# Patient Record
Sex: Male | Born: 1965 | Race: Black or African American | Hispanic: No | Marital: Married | Smoking: Never smoker
Health system: Southern US, Community
[De-identification: ages and names within clinical notes are randomized; demographics above are authoritative.]

## PROBLEM LIST (undated history)

## (undated) DIAGNOSIS — I10 Essential (primary) hypertension: Secondary | ICD-10-CM

## (undated) HISTORY — DX: Essential (primary) hypertension: I10

---

## 2000-02-16 ENCOUNTER — Encounter (HOSPITAL_BASED_OUTPATIENT_CLINIC_OR_DEPARTMENT_OTHER): Payer: Self-pay | Admitting: General Surgery

## 2000-02-18 ENCOUNTER — Ambulatory Visit (HOSPITAL_COMMUNITY): Admission: RE | Admit: 2000-02-18 | Discharge: 2000-02-18 | Payer: Self-pay | Admitting: General Surgery

## 2000-02-18 ENCOUNTER — Encounter (INDEPENDENT_AMBULATORY_CARE_PROVIDER_SITE_OTHER): Payer: Self-pay | Admitting: *Deleted

## 2001-09-17 ENCOUNTER — Emergency Department (HOSPITAL_COMMUNITY): Admission: EM | Admit: 2001-09-17 | Discharge: 2001-09-17 | Payer: Self-pay | Admitting: *Deleted

## 2009-07-29 ENCOUNTER — Observation Stay (HOSPITAL_COMMUNITY): Admission: EM | Admit: 2009-07-29 | Discharge: 2009-07-30 | Payer: Self-pay | Admitting: Emergency Medicine

## 2009-07-31 DIAGNOSIS — R079 Chest pain, unspecified: Secondary | ICD-10-CM | POA: Insufficient documentation

## 2009-08-05 ENCOUNTER — Ambulatory Visit: Payer: Self-pay | Admitting: Cardiovascular Disease

## 2009-08-05 DIAGNOSIS — I517 Cardiomegaly: Secondary | ICD-10-CM | POA: Insufficient documentation

## 2009-08-05 DIAGNOSIS — I10 Essential (primary) hypertension: Secondary | ICD-10-CM | POA: Insufficient documentation

## 2009-08-21 ENCOUNTER — Encounter: Payer: Self-pay | Admitting: Cardiovascular Disease

## 2009-08-21 ENCOUNTER — Ambulatory Visit: Payer: Self-pay

## 2009-08-21 ENCOUNTER — Ambulatory Visit: Payer: Self-pay | Admitting: Cardiology

## 2009-08-21 ENCOUNTER — Ambulatory Visit (HOSPITAL_COMMUNITY): Admission: RE | Admit: 2009-08-21 | Discharge: 2009-08-21 | Payer: Self-pay | Admitting: Cardiovascular Disease

## 2009-09-04 ENCOUNTER — Ambulatory Visit: Payer: Self-pay | Admitting: Cardiovascular Disease

## 2009-09-04 DIAGNOSIS — I119 Hypertensive heart disease without heart failure: Secondary | ICD-10-CM | POA: Insufficient documentation

## 2010-09-30 NOTE — Assessment & Plan Note (Signed)
Summary: per check out/sf   Visit Type:  Follow-up Primary Provider:  No PCP at this time  CC:  no cardiac complaints today.  History of Present Illness: 45 yo AAM with recently diagnosed HTN who was admitted to [facility] last month for chest pain and was monitored overnight. He ruled out for an MI with serial cardiac enzymes and was discharged to home. He was started on Metoprolol. He has had no recurrence of pain since he left the hospital. The pain at the time of admission is described as a dull ache that lasted for four days. He tells me that it was more of abnormal sensation than pain. No associated SOB, diaphoresis, dizziness, nausea or vomiting. It was continuous for four days without waxing or waning in intensity. I saw him four weeks ago and increased his Metoprolol to 25 mg two times a day. I also ordered an echo with the finding of possible LVH on his ekg.   He is here today for follow up. He has had no recurrence of his chest pain. He has no other complaints.   Current Medications (verified): 1)  Metoprolol Tartrate 25 Mg Tabs (Metoprolol Tartrate) .... Take One Tablet By Mouth Twice A Day  Allergies (verified): No Known Drug Allergies  Past History:  Past Medical History: Reviewed history from 08/05/2009 and no changes required. HTN     Social History: Reviewed history from 08/05/2009 and no changes required. No tobacco No alcohol No ilicit drug use Married 3 children Self employed accounting and tax work  Review of Systems  The patient denies fatigue, malaise, fever, weight gain/loss, vision loss, decreased hearing, hoarseness, chest pain, palpitations, shortness of breath, prolonged cough, wheezing, sleep apnea, coughing up blood, abdominal pain, blood in stool, nausea, vomiting, diarrhea, heartburn, incontinence, blood in urine, muscle weakness, joint pain, leg swelling, rash, skin lesions, headache, fainting, dizziness, depression, anxiety, enlarged lymph nodes, easy  bruising or bleeding, and environmental allergies.    Vital Signs:  Patient profile:   45 year old male Height:      67 inches Weight:      197 pounds BMI:     30.97 Pulse rate:   68 / minute Pulse rhythm:   regular BP sitting:   120 / 90  (left arm) Cuff size:   large  Vitals Entered By: Danielle Rankin, CMA (September 04, 2009 4:15 PM)  Physical Exam  General:  General: Well developed, well nourished, NAD HEENT: OP clear, mucus membranes moist Psychiatric: Mood and affect normal Neck: No JVD, no carotid bruits, no thyromegaly, no lymphadenopathy. Lungs:Clear bilaterally, no wheezes, rhonci, crackles CV: RRR no murmurs, gallops rubs Abdomen: soft, NT, ND, BS present Extremities: No edema, pulses 2+.    Echocardiogram  Procedure date:  08/21/2009  Findings:      Left ventricle: The cavity size was normal. Wall thickness was     increased in a pattern of mild LVH. Systolic function was normal.     The estimated ejection fraction was in the range of 60% to 65%. Wall     motion was normal; there were no regional wall motion abnormalities.     Doppler parameters are consistent with abnormal left ventricular     relaxation (grade 1 diastolic dysfunction).   Impression & Recommendations:  Problem # 1:  HYPERTENSION, HEART CONTROLLED W/O ASSOC CHF (ICD-402.10) Echo with normal LV systolic function and mild LVH with grade 1 diastolic dysfunction. I have discussed the importance of good blood pressure control  as well as diet, weight loss and exercise. Repeat echo one year. Follow up one year.   His updated medication list for this problem includes:    Metoprolol Tartrate 25 Mg Tabs (Metoprolol tartrate) .Marland Kitchen... Take one tablet by mouth twice a day  Patient Instructions: 1)  Your physician recommends that you schedule a follow-up appointment in: 12 months 2)  Your physician has requested that you have an echocardiogram.  Echocardiography is a painless test that uses sound waves to  create images of your heart. It provides your doctor with information about the size and shape of your heart and how well your heart's chambers and valves are working.  This procedure takes approximately one hour. There are no restrictions for this procedure.  To be done in about a year prior to office visit

## 2010-12-03 LAB — CBC
HCT: 40.7 % (ref 39.0–52.0)
HCT: 43.1 % (ref 39.0–52.0)
Hemoglobin: 13.4 g/dL (ref 13.0–17.0)
Hemoglobin: 14.3 g/dL (ref 13.0–17.0)
MCHC: 33.2 g/dL (ref 30.0–36.0)
MCV: 86 fL (ref 78.0–100.0)
Platelets: 223 10*3/uL (ref 150–400)
Platelets: 238 10*3/uL (ref 150–400)
RBC: 5.01 MIL/uL (ref 4.22–5.81)
RDW: 12.8 % (ref 11.5–15.5)
WBC: 6.1 10*3/uL (ref 4.0–10.5)
WBC: 7.5 10*3/uL (ref 4.0–10.5)

## 2010-12-03 LAB — LIPID PANEL
HDL: 24 mg/dL — ABNORMAL LOW (ref >39)
LDL Cholesterol: 65 mg/dL (ref 0–99)
Triglycerides: 396 mg/dL — ABNORMAL HIGH (ref <150)
VLDL: 79 mg/dL — ABNORMAL HIGH (ref 0–40)

## 2010-12-03 LAB — COMPREHENSIVE METABOLIC PANEL
ALT: 22 U/L (ref 0–53)
AST: 23 U/L (ref 0–37)
Albumin: 4.1 g/dL (ref 3.5–5.2)
Alkaline Phosphatase: 67 U/L (ref 39–117)
BUN: 17 mg/dL (ref 6–23)
CO2: 27 mEq/L (ref 19–32)
Calcium: 9.1 mg/dL (ref 8.4–10.5)
Chloride: 106 mEq/L (ref 96–112)
Creatinine, Ser: 1 mg/dL (ref 0.4–1.5)
GFR calc Af Amer: >60 mL/min (ref >60)
GFR calc non Af Amer: >60 mL/min (ref >60)
Glucose, Bld: 98 mg/dL (ref 70–99)
Potassium: 4.8 mEq/L (ref 3.5–5.1)
Sodium: 138 mEq/L (ref 135–145)
Total Bilirubin: 0.8 mg/dL (ref 0.3–1.2)
Total Protein: 7.4 g/dL (ref 6.0–8.3)

## 2010-12-03 LAB — DIFFERENTIAL
Basophils Absolute: 0 10*3/uL (ref 0.0–0.1)
Basophils Relative: 1 % (ref 0–1)
Eosinophils Absolute: 0.1 10*3/uL (ref 0.0–0.7)
Eosinophils Relative: 2 % (ref 0–5)
Lymphocytes Relative: 45 % (ref 12–46)
Lymphs Abs: 2.8 10*3/uL (ref 0.7–4.0)
Monocytes Absolute: 0.4 10*3/uL (ref 0.1–1.0)
Monocytes Relative: 6 % (ref 3–12)
Neutro Abs: 2.8 10*3/uL (ref 1.7–7.7)
Neutrophils Relative %: 46 % (ref 43–77)

## 2010-12-03 LAB — CARDIAC PANEL(CRET KIN+CKTOT+MB+TROPI)
CK, MB: 1.8 ng/mL (ref 0.3–4.0)
Relative Index: 0.4 (ref 0.0–2.5)
Troponin I: 0.01 ng/mL (ref 0.00–0.06)

## 2010-12-03 LAB — POCT CARDIAC MARKERS
CKMB, poc: 1.2 ng/mL (ref 1.0–8.0)
Myoglobin, poc: 103 ng/mL (ref 12–200)
Troponin i, poc: <0.05 ng/mL (ref 0.00–0.09)

## 2010-12-03 LAB — BASIC METABOLIC PANEL
BUN: 17 mg/dL (ref 6–23)
Chloride: 107 mEq/L (ref 96–112)
Glucose, Bld: 112 mg/dL — ABNORMAL HIGH (ref 70–99)
Potassium: 4.2 mEq/L (ref 3.5–5.1)

## 2010-12-03 LAB — CK TOTAL AND CKMB (NOT AT ARMC): Total CK: 503 U/L — ABNORMAL HIGH (ref 7–232)

## 2011-01-16 NOTE — Op Note (Signed)
[place]  Patient:    Logan Garza, Logan Garza                       MRN: [id number] Proc. Date: 02/18/00 Adm. Date:  [id number] Disc. Date: [id number] Attending:  Sonda Primes CC:         Mardene Celeste. Lurene Shadow, M.D. (2)                           Operative Report  PREOPERATIVE DIAGNOSIS: 1. Left inguinal hernia. 2. For elective sterilization.  POSTOPERATIVE DIAGNOSIS: 1. Left inguinal hernia. 2. For elective sterilization.  OPERATION PERFORMED: 1. Left inguinal herniorrhaphy with Prolene mesh. 2. Elective sterilization performed by Dr. Su Grand.  That dictation will    be a separate note.  SURGEON:  Mardene Celeste. Lurene Shadow, M.D.  ANESTHESIA:  General with supplemental Xylocaine with epinephrine.  INDICATIONS FOR PROCEDURE:  The patient is a 45 year old man presenting with a large left inguinal hernia which extends into the scrotum.  He has been noticing this hernia for the last several months as it enlarged and it is getting to the point now where it is causing discomfort.  The patient also seeks elective sterilization and presented originally to Dr. Su Grand, for same and the operations are planned to be performed concurrently.  DESCRIPTION OF PROCEDURE:   Following the induction of anesthesia, the patient was positioned supinely.  The groin and scrotum were prepped and draped to be included in a sterile operative field.  I did infiltrate the left lower abdominal crease with 1% Xylocaine with epinephrine and a transverse incision was made, deepened through the skin and subcutaneous tissues, carrying the dissection down to the external oblique aponeurosis.  The external oblique aponeurosis was opened with protection of the ilioinguinal nerve which was retracted medially and cephalad. The spermatic cord and large indirect hernial sac was then elevated and held with a Penrose drain.  The cremasteric fibers and the anteromedial aspect of the cord  were then dissected, carrying the dissection down to the sac.  The sac was opened and the scrotal contents of the sac were then delivered into the wound and then reduced into the peritoneal cavity.  The sac was then transected at its midpoint carrying dissection of the superior portion of the sac up to the internal ring where the sac was then triply suture ligated at the internal ring thus performing a high ligation.  The redundant sac was amputated and forwarded for pathologic evaluation.  The scrotal portion of the sac was then dissected free and removed and forwarded for pathologic evaluation also.  At that point, Dr. Brunilda Payor entered the operative field and performed a left-sided vasectomy.  He also went on to perform a right-sided vasectomy transscrotally.  On the left side the spermatic cord was elevated and retracted inferiorly and to the left.  A Marlex patch was placed over the direct space and sutured in at the pubic tubercle carrying the suture line up along the conjoined tendon to the internal ring and again from the pubic tubercle up along the shelving edge of Pouparts ligament to the internal ring.  The mesh was split so as to allow protrusion of the cord.  Tails of the mesh were then trimmed and sutured down to the internal oblique muscles.  Sponge, instrument and sharp counts were verified.  All areas of dissection checked for hemostasis.  There was no bleeding  noted.  The external oblique aponeurosis was reapproximated with 2-0 Vicryl thus recreating the external ring.  Scarpas fascia was closed with running 3-0 Vicryl suture and the skin was closed with 4-0 Monocryl running subcuticular suture and then reinforced with Steri-Strips.  Sterile dressing was appled.  Anesthetic reversed.  Patient removed from the operating room to the recovery room in stable condition having tolerated the procedure well. DD:  02/18/00 TD:  02/20/00 Job: [id number]

## 2011-01-16 NOTE — Op Note (Signed)
[place]. [facility]  Patient:    Logan Garza, Logan Garza                       MRN: [id number] Proc. Date: 02/18/00 Adm. Date:  [id number] Disc. Date: [id number] Attending:  Sonda Primes CC:         Janine Limbo, M.D.             Mardene Celeste Lurene Shadow, M.D.                           Operative Report  PREOPERATIVE DIAGNOSIS:  Left inguinal hernia and elective sterilization.  PROCEDURE DONE:  Bilateral vasectomy and left inguinal hernia repair.  SURGEONS:  Inguinal hernia repair - Luisa Hart L. Lurene Shadow, M.D.            Vasectomy - Lindaann Slough, M.D.  ANESTHESIA:  General.  INDICATIONS:  The patient is a 45 year old male, who has three children and would like to have a vasectomy.  He was found on physical examination to have a left inguinal hernia.  Patient was referred to Dr. Lurene Shadow for evaluation and management of the left inguinal hernia.  He is scheduled today for left inguinal hernia repair and vasectomy.  DESCRIPTION OF PROCEDURE:  A left inguinal incision was made by Dr. Lurene Shadow and he did a left inguinal hernia repair and Dr. Lurene Shadow will dictate this portion of the procedure.  Through the inguinal incision, the vas was identified and freed from the surrounding tissues.  A segment of the scrotal portion of the vas was excised. The end of the vas was then fulgurated.  Each end of the vas was reversed and doubly ligated with #0 Vicryl.  The distal end of the vas was then covered with a sheath of the vas.  The cord was then replaced in the scrotum and the rest of the procedure was carried out by Dr. Lurene Shadow.   Then a right scrotal incision was made.  The vas was identified, freed from the surrounding tissues and secured with Allis clamp.  A segment of the vas was also excised.  The ends of the vas were then fulgurated with electrocautery.  Then, each end of the vas was reversed and doubly ligated with #0 Vicryl.  The distal end of the vas was then  placed in the deeper layers of the scrotum and the proximal end of the vas was left in the superficial layers.  The scrotum was then closed #3-0 Vicryl.  The patient tolerated the procedure well and left the OR in satisfactory condition to post anesthesia care unit. DD:  02/18/00 TD:  02/20/00 Job: [id number]

## 2011-02-04 IMAGING — CR DG CHEST 1V PORT
1 series · 1 of 1 positions shown · non-contrast
Comparison: None

CLINICAL DATA: Chest pain

PORTABLE CHEST - 1 VIEW

[view not recorded]
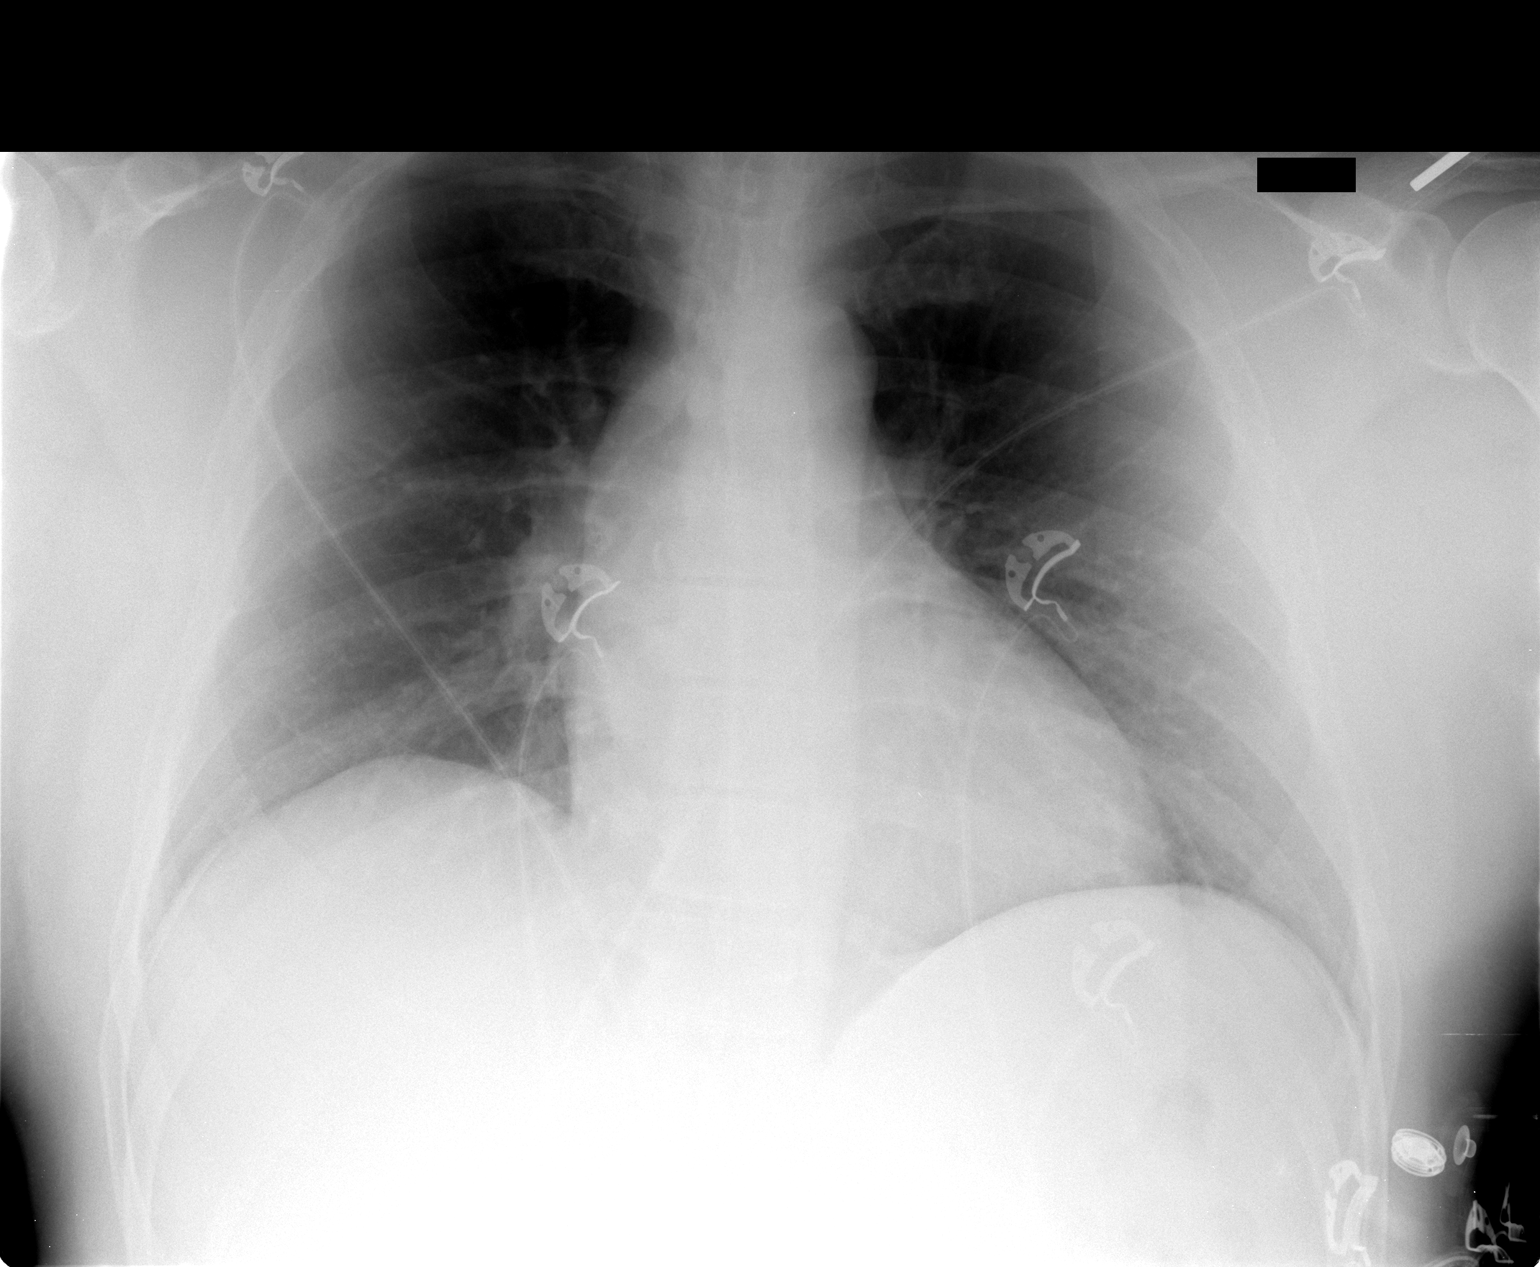

[1 of 1 positions shown; findings below may reference images not displayed]

FINDINGS: The cardiomediastinal silhouette is unremarkable.  No
acute infiltrate or pleural effusion.  No pulmonary edema.  Bony
thorax is unremarkable.
IMPRESSION: No active disease.

## 2011-05-31 ENCOUNTER — Other Ambulatory Visit: Payer: Self-pay | Admitting: Cardiovascular Disease

## 2011-06-09 ENCOUNTER — Other Ambulatory Visit: Payer: Self-pay | Admitting: *Deleted

## 2011-06-09 MED ORDER — METOPROLOL TARTRATE 25 MG PO TABS: 25.0000 mg | ORAL_TABLET | Freq: Two times a day (BID) | ORAL | Status: DC

## 2011-10-19 ENCOUNTER — Other Ambulatory Visit: Payer: Self-pay | Admitting: Cardiovascular Disease

## 2012-01-26 ENCOUNTER — Other Ambulatory Visit: Payer: Self-pay | Admitting: Cardiovascular Disease

## 2012-02-16 ENCOUNTER — Other Ambulatory Visit: Payer: Self-pay | Admitting: Cardiovascular Disease

## 2012-03-09 ENCOUNTER — Telehealth: Payer: Self-pay | Admitting: Cardiovascular Disease

## 2012-03-09 ENCOUNTER — Ambulatory Visit (INDEPENDENT_AMBULATORY_CARE_PROVIDER_SITE_OTHER): Payer: Self-pay | Admitting: Physician Assistant

## 2012-03-09 ENCOUNTER — Encounter: Payer: Self-pay | Admitting: Physician Assistant

## 2012-03-09 VITALS — BP 148/96 | HR 67 | Ht 67.0 in | Wt 201.0 lb

## 2012-03-09 DIAGNOSIS — I10 Essential (primary) hypertension: Secondary | ICD-10-CM

## 2012-03-09 DIAGNOSIS — I517 Cardiomegaly: Secondary | ICD-10-CM

## 2012-03-09 MED ORDER — HYDROCHLOROTHIAZIDE 25 MG PO TABS: 25.0000 mg | ORAL_TABLET | Freq: Every day | ORAL | Status: DC

## 2012-03-09 MED ORDER — LOSARTAN POTASSIUM 50 MG PO TABS: 50.0000 mg | ORAL_TABLET | Freq: Every day | ORAL | Status: DC

## 2012-03-09 MED ORDER — OLMESARTAN MEDOXOMIL-HCTZ 40-25 MG PO TABS: 1.0000 | ORAL_TABLET | Freq: Every day | ORAL | Status: DC

## 2012-03-09 NOTE — Assessment & Plan Note (Signed)
Blood pressure is high today but has been out of his metoprolol all since last week. He says his blood pressure still runs high even on the metoprolol. He would like to change drugs because of recent memory loss problems. Will start Benicar 40/25 mg daily. He is to take his blood pressure regularly at home and call with his readings.He's also been instructed on a low sodium diet. He will follow up with Dr. Sanjuana Kava in one month.

## 2012-03-09 NOTE — Assessment & Plan Note (Signed)
Patient has LVH on his EKG. He had LVH on 2-D echo in 2010. I did not order a repeat 2-D echo at this time.

## 2012-03-09 NOTE — Telephone Encounter (Signed)
Pt was given a new medication and told [facility] would have it for $10 & he is being charged $590 is there something else he can take

## 2012-03-09 NOTE — Telephone Encounter (Signed)
We can try generic Cozaar (Losartan) 50 mg per day and do the HCTZ 25 mg po qDaily as a different med. Should both be generic.

## 2012-03-09 NOTE — Progress Notes (Signed)
HPI:   This is a 46 year old African American male patient who was initially seen in 2010 for atypical chest pain. Turned out he had hypertension with LVH and has grade 1 diastolic dysfunction on 2-D echo in 2010. No ischemia workup was performed.  The patient comes in today needing refills on his metoprolol. He ran out last week. He says his blood pressure runs high even while on metoprolol. He says his diastolic numbers are always in the low 90s. He has recently noticed memory problems and would like to try a new drug. 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 He read that metoprolol could be causing this and he would like to try something else. He does admit to getting too much salt in his diet with canned soups and other high sodium foods. He does exercise at the [employer] 3 days a week walking on treadmill and doing weights.  No Allergies   Current Outpatient Prescriptions on File Prior to Visit: metoprolol tartrate (LOPRESSOR) 25 MG tablet, TAKE 1 TABLET BY MOUTH TWICE A DAY, Disp: 20 tablet, Rfl: 0 metoprolol tartrate (LOPRESSOR) 25 MG tablet, TAKE 1 TABLET BY MOUTH TWICE A DAY, Disp: 30 tablet, Rfl: 0     Social History   Marital Status: Married             Spouse Name:                      Years of Education:                 Number of children:  3           Occupational History Accountant  Social History Main Topics   Smoking Status: Not on file                      Smokeless Status: Not on file                      Alcohol Use: Not on file    Drug Use: Not on file    Sexual Activity: Not on file        Other Topics            Concern   None on file      ROS: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 Neck: No JVD, HJR, Bruit, or thyroid enlargement  Lungs: No tachypnea, clear without wheezing, rales, or  rhonchi  Cardiovascular: RRR, PMI not displaced, heart sounds normal, no murmurs, gallops, bruit, thrill, or heave.  Abdomen: BS normal. Soft without organomegaly, masses, lesions or tenderness.  Extremities: without cyanosis, clubbing or edema. Good distal pulses bilateral  SKin: Warm, no lesions or rashes   Musculoskeletal: No deformities  Neuro: no focal signs  BP 148/96, Pulse 67, weight 201, height 5'7, O2sat 98%   AVW:[id number] sinus rhythm at 70 beats per minute with LVH  2D echo Procedure date:  08/21/2009   Findings:      Left ventricle: The cavity size was normal. Wall thickness was     increased in a pattern of mild LVH. Systolic function was normal.     The estimated ejection fraction was in the range of 60% to 65%. Wall     motion was normal; there were no regional wall motion abnormalities.  Doppler parameters are consistent with abnormal left ventricular     relaxation (grade 1 diastolic dysfunction

## 2012-03-09 NOTE — Telephone Encounter (Signed)
Pt saw Jacolyn Reedy, PA today and was started on Benicar -HCT 40/25 mg daily

## 2012-03-09 NOTE — Telephone Encounter (Signed)
Spoke with pt and gave him instructions from Dr. Clifton James. I priced medications and they are cheaper at [facility] than pharmacy pt usually uses.  He would like to have filled at [facility] near [facility] for 30 day supply.

## 2012-03-09 NOTE — Telephone Encounter (Signed)
Spoke with pt and told him I would check with Dr. Clifton James and see if there is a cheaper alternative.  He has not been on any medications for blood pressure in past except metoprolol.

## 2012-03-09 NOTE — Patient Instructions (Addendum)
Your physician recommends that you schedule a follow-up appointment in: 1 month with Dr Clifton James Your physician has recommended you make the following change in your medication: START Benicar HCT 40/25 mg daily and STOP Metoprolol  Your physician has requested that you regularly monitor and record your blood pressure readings at home. Please use the same machine at the same time of day to check your readings and record them to bring to your follow-up visit. If your BP is over 140/90 after a week of Benicar please call us.      2 Gram Low Sodium Diet A 2 gram sodium diet restricts the amount of sodium in the diet to no more than 2 g or 2000 mg daily. Limiting the amount of sodium is often used to help lower blood pressure. It is important if you have heart, liver, or kidney problems. Many foods contain sodium for flavor and sometimes as a preservative. When the amount of sodium in a diet needs to be low, it is important to know what to look for when choosing foods and drinks. The following includes some information and guidelines to help make it easier for you to adapt to a low sodium diet. QUICK TIPS  Do not add salt to food.   Avoid convenience items and fast food.   Choose unsalted snack foods.   Buy lower sodium products, often labeled as "lower sodium" or "no salt added."   Check food labels to learn how much sodium is in 1 serving.   When eating at a restaurant, ask that your food be prepared with less salt or none, if possible.  READING FOOD LABELS FOR SODIUM INFORMATION The nutrition facts label is a good place to find how much sodium is in foods. Look for products with no more than 500 to 600 mg of sodium per meal and no more than 150 mg per serving. Remember that 2 g = 2000 mg. The food label may also list foods as:  Sodium-free: Less than 5 mg in a serving.   Very low sodium: 35 mg or less in a serving.   Low-sodium: 140 mg or less in a serving.   Light in sodium: 50% less  sodium in a serving. For example, if a food that usually has 300 mg of sodium is changed to become light in sodium, it will have 150 mg of sodium.   Reduced sodium: 25% less sodium in a serving. For example, if a food that usually has 400 mg of sodium is changed to reduced sodium, it will have 300 mg of sodium.  CHOOSING FOODS Grains  Avoid: Salted crackers and snack items. Some cereals, including instant hot cereals. Bread stuffing and biscuit mixes. Seasoned rice or pasta mixes.   Choose: Unsalted snack items. Low-sodium cereals, oats, puffed wheat and rice, shredded wheat. English muffins and bread. Pasta.  Meats  Avoid: Salted, canned, smoked, spiced, pickled meats, including fish and poultry. Bacon, ham, sausage, cold cuts, hot dogs, anchovies.   Choose: Low-sodium canned tuna and salmon. Fresh or frozen meat, poultry, and fish.  Dairy  Avoid: Processed cheese and spreads. Cottage cheese. Buttermilk and condensed milk. Regular cheese.   Choose: Milk. Low-sodium cottage cheese. Yogurt. Sour cream. Low-sodium cheese.  Fruits and Vegetables  Avoid: Regular canned vegetables. Regular canned tomato sauce and paste. Frozen vegetables in sauces. Olives. Rosita Fire. Relishes. Sauerkraut.   Choose: Low-sodium canned vegetables. Low-sodium tomato sauce and paste. Frozen or fresh vegetables. Fresh and frozen fruit.  Condiments  Avoid:  Canned and packaged gravies. Worcestershire sauce. Tartar sauce. Barbecue sauce. Soy sauce. Steak sauce. Ketchup. Onion, garlic, and table salt. Meat flavorings and tenderizers.   Choose: Fresh and dried herbs and spices. Low-sodium varieties of mustard and ketchup. Lemon juice. Tabasco sauce. Horseradish.  SAMPLE 2 GRAM SODIUM MEAL PLAN Breakfast / Sodium (mg)  1 cup low-fat milk / 143 mg   2 slices whole-wheat toast / 270 mg   1 tbs heart-healthy margarine / 153 mg   1 hard-boiled egg / 139 mg   1 small orange / 0 mg  Lunch / Sodium (mg)  1 cup raw  carrots / 76 mg    cup hummus / 298 mg   1 cup low-fat milk / 143 mg    cup red grapes / 2 mg   1 whole-wheat pita bread / 356 mg  Dinner / Sodium (mg)  1 cup whole-wheat pasta / 2 mg   1 cup low-sodium tomato sauce / 73 mg   3 oz lean ground beef / 57 mg   1 small side salad (1 cup raw spinach leaves,  cup cucumber,  cup yellow bell pepper) with 1 tsp olive oil and 1 tsp red wine vinegar / 25 mg  Snack / Sodium (mg)  1 container low-fat vanilla yogurt / 107 mg   3 graham cracker squares / 127 mg  Nutrient Analysis  Calories: 2033   Protein: 77 g   Carbohydrate: 282 g   Fat: 72 g   Sodium: 1971 mg  Document Released: 08/17/2005 Document Revised: 08/06/2011 Document Reviewed: 11/18/2009 [facility] Patient Information 2012 [place], [place].

## 2012-03-09 NOTE — Telephone Encounter (Signed)
What is the medication?

## 2012-04-18 ENCOUNTER — Ambulatory Visit: Payer: Self-pay | Admitting: Cardiovascular Disease

## 2012-04-29 ENCOUNTER — Encounter: Payer: Self-pay | Admitting: Cardiovascular Disease

## 2012-05-03 ENCOUNTER — Ambulatory Visit (INDEPENDENT_AMBULATORY_CARE_PROVIDER_SITE_OTHER): Payer: Self-pay | Admitting: Cardiovascular Disease

## 2012-05-03 ENCOUNTER — Encounter: Payer: Self-pay | Admitting: Cardiovascular Disease

## 2012-05-03 VITALS — BP 135/91 | HR 83 | Ht 67.0 in | Wt 198.4 lb

## 2012-05-03 DIAGNOSIS — I10 Essential (primary) hypertension: Secondary | ICD-10-CM

## 2012-05-03 NOTE — Patient Instructions (Addendum)
Your physician wants you to follow-up in:  12 months.  You will receive a reminder letter in the mail two months in advance. If you don't receive a letter, please call our office to schedule the follow-up appointment.   

## 2012-05-03 NOTE — Progress Notes (Signed)
   History of Present Illness: This is a 46 year old African American male patient who was initially seen in 2010 for atypical chest pain. He has since been found to have hypertension with LVH and has grade 1 diastolic dysfunction on 2-D echo in 2010. No ischemia workup was performed. He was seen in f/u by Wyatt Mage, PA-C on 03/09/12 and his beta blocker was stopped due to ? Memory issues. He was started on Cozaar 50 mg po Qdaily and HCTZ 25 po Qdaily. His BP has been well controlled at home. He has been feeling well. No chest pain or SOB.   Primary Care Physician: None   Past Medical History  Diagnosis Date  . Hypertension     No past surgical history on file.  Current Outpatient Prescriptions  Medication Sig Dispense Refill  . hydrochlorothiazide (HYDRODIURIL) 25 MG tablet Take 1 tablet (25 mg total) by mouth daily.  30 tablet  6  . losartan (COZAAR) 50 MG tablet Take 1 tablet (50 mg total) by mouth daily.  30 tablet  6    No Known Allergies  History   Social History  . Marital Status: Married    Spouse Name: N/A    Number of Children: N/A  . Years of Education: N/A   Occupational History  . Not on file.   Social History Main Topics  . Smoking status: Never Smoker   . Smokeless tobacco: Not on file  . Alcohol Use:   . Drug Use: No  . Sexually Active:    Other Topics Concern  . Not on file   Social History Narrative  . No narrative on file    Family History  Problem Relation Age of Onset  . Hypertension Mother   . Hypertension Father     Review of Systems:  As stated in the HPI and otherwise negative.   BP 135/91  Pulse 83  Ht 5\' 7"  (1.702 m)  Wt 198 lb 6.4 oz (89.994 kg)  BMI 31.07 kg/m2  Physical Examination: General: Well developed, well nourished, NAD HEENT: OP clear, mucus membranes moist SKIN: warm, dry. No rashes. Neuro: No focal deficits Musculoskeletal: Muscle strength 5/5 all ext Psychiatric: Mood and affect normal Neck: No JVD, no  carotid bruits, no thyromegaly, no lymphadenopathy. Lungs:Clear bilaterally, no wheezes, rhonci, crackles Cardiovascular: Regular rate and rhythm. No murmurs, gallops or rubs. Abdomen:Soft. Bowel sounds present. Non-tender.  Extremities: No lower extremity edema. Pulses are 2 + in the bilateral DP/PT.  Assessment and Plan:   1. HTN: BP better on Cozaar and HCTZ. No changes. He will follow at home.

## 2012-10-29 ENCOUNTER — Other Ambulatory Visit: Payer: Self-pay | Admitting: Cardiovascular Disease

## 2012-11-01 ENCOUNTER — Other Ambulatory Visit: Payer: Self-pay | Admitting: *Deleted

## 2012-11-01 MED ORDER — HYDROCHLOROTHIAZIDE 25 MG PO TABS: 25.0000 mg | ORAL_TABLET | Freq: Every day | ORAL | Status: DC

## 2012-11-01 MED ORDER — LOSARTAN POTASSIUM 50 MG PO TABS: 50.0000 mg | ORAL_TABLET | Freq: Every day | ORAL | Status: DC

## 2012-11-01 NOTE — Telephone Encounter (Signed)
Verifying with patient to sent rx request to [facility] on [address] NOT [facility] in [place].    Losartan 50mg  once daily, #30 5 refills Hctz 25mg  once daily, #30 5 refills   Micki Riley, CMA

## 2012-12-30 ENCOUNTER — Other Ambulatory Visit: Payer: Self-pay

## 2012-12-30 DIAGNOSIS — I10 Essential (primary) hypertension: Secondary | ICD-10-CM

## 2012-12-30 MED ORDER — HYDROCHLOROTHIAZIDE 25 MG PO TABS: 25.0000 mg | ORAL_TABLET | Freq: Every day | ORAL | Status: DC

## 2013-05-12 ENCOUNTER — Other Ambulatory Visit: Payer: Self-pay | Admitting: Cardiovascular Disease

## 2013-07-23 ENCOUNTER — Other Ambulatory Visit: Payer: Self-pay | Admitting: Cardiovascular Disease

## 2013-11-17 ENCOUNTER — Other Ambulatory Visit: Payer: Self-pay

## 2013-11-17 MED ORDER — LOSARTAN POTASSIUM 50 MG PO TABS: ORAL_TABLET | ORAL | Status: DC

## 2013-12-17 ENCOUNTER — Other Ambulatory Visit: Payer: Self-pay | Admitting: Cardiovascular Disease

## 2014-01-13 ENCOUNTER — Other Ambulatory Visit: Payer: Self-pay | Admitting: Cardiovascular Disease

## 2014-02-12 ENCOUNTER — Other Ambulatory Visit: Payer: Self-pay

## 2014-02-12 MED ORDER — LOSARTAN POTASSIUM 50 MG PO TABS: ORAL_TABLET | ORAL | Status: DC

## 2014-02-22 ENCOUNTER — Encounter: Payer: Self-pay | Admitting: Cardiology

## 2014-02-22 ENCOUNTER — Ambulatory Visit (INDEPENDENT_AMBULATORY_CARE_PROVIDER_SITE_OTHER): Payer: 59 | Admitting: Cardiology

## 2014-02-22 VITALS — BP 124/76 | HR 72 | Ht 67.0 in | Wt 196.7 lb

## 2014-02-22 DIAGNOSIS — I10 Essential (primary) hypertension: Secondary | ICD-10-CM

## 2014-02-22 DIAGNOSIS — Z8639 Personal history of other endocrine, nutritional and metabolic disease: Secondary | ICD-10-CM

## 2014-02-22 DIAGNOSIS — Z862 Personal history of diseases of the blood and blood-forming organs and certain disorders involving the immune mechanism: Secondary | ICD-10-CM

## 2014-02-22 DIAGNOSIS — Z79899 Other long term (current) drug therapy: Secondary | ICD-10-CM

## 2014-02-22 MED ORDER — HYDROCHLOROTHIAZIDE 25 MG PO TABS: 25.0000 mg | ORAL_TABLET | Freq: Every day | ORAL | Status: DC

## 2014-02-22 MED ORDER — LOSARTAN POTASSIUM 50 MG PO TABS: ORAL_TABLET | ORAL | Status: DC

## 2014-02-22 NOTE — Patient Instructions (Signed)
Increase physical activity, to start out, walk about 25-30 minutes at least 5 days a week.  Brittainy has ordered you to have some blood work done. The labs have to be done FASTING at a [facility] lab. [facility] is a [facility] in [place]. The address is:  [address] [place] [phone]

## 2014-02-22 NOTE — Progress Notes (Signed)
Patient ID: Logan Garza, male   DOB: [birth year], 48 y.o.   MRN: [id number]    02/22/2014 CarmelCarmelina Daneina DaneJassen R Garza   [birth year]  [id number]  Primary Physicia No PCP Per Patient Primary Cardiologist: Dr. Clifton Garza  HPI:  The patient is a 48 year old male followed by Dr. Clifton Garza. He has not been seen in the office since September 2013. He was initially seen in 2010 for atypical chest pain. He has since been found to have hypertension with left ventricular hypertrophy and had grade 1 diastolic dysfunction on his 2-D echo in 2010. He has been treated with hydrochlorothiazide and Cozaar.  He presents to clinic today for followup and states that he is in need of refills of his antihypertensive. He states that he had been fully compliant with his medications until recently. He went [place] and the day after he returned, he ran out of his medications. This was about 4 weeks ago. He decided to see how well he would do without being on medications. He monitored his blood pressure closely over the last few weeks and has noticed systolic blood pressures as high as the 190s. During this time he also noted feeling very poorly and also had mild associated chest pressure. He denies any shortness of breath, headaches or visual changes. Several days ago, he contacted the office and requested refills of his medication. He was advised to followup to see a provider.  Since his medications have been refilled, he reports daily compliance of both his hydrochlorothiazide and his Cozaar. His blood pressure today is well-controlled at 124/76. He denies any recurrent symptoms of chest discomfort. He states that he has a large staircase in his home and denies any anginal like pain or dyspnea on exertion walking the stairs. He also has no limitations walking up hills or with any other physical activity. He denies orthopnea, PND and lower extremity edema.   His desire is to eventually come off of his blood pressure  medications. He wishes to do so through changes in lifestyle habits. He admits that he does not get enough physical activity, mainly because he works on average 50 hours per week. He does state that his diet in the past has been fairly poor, noting frequent eating on the go including fast food restaurants. However, recently he has cut back on eating out and now cooks more at home and prepares his own meals. He does not smoke and he denies any excessive caffeine intake.  Review of his records, reveals that he had an abnormal lipid panel in 2010 that revealed severely elevated triglycerides at 396. He has not on any medications for cholesterol. He is not currently have a primary care physician and states that he has not been evaluated since 2013.   His EKG in clinic today reveals normal sinus rhythm with a ventricular rate of 72 beats per minute. Left ventricular hypertrophy is noted. There are no signs of ischemia.  Current Outpatient Prescriptions  Medication Sig Dispense Refill  . hydrochlorothiazide (HYDRODIURIL) 25 MG tablet Take 1 tablet (25 mg total) by mouth daily.  30 tablet  5  . losartan (COZAAR) 50 MG tablet TAKE 1 TABLET BY MOUTH DAILY  30 tablet  0   No current facility-administered medications for this visit.    No Known Allergies  History   Social History  . Marital Status: Married    Spouse Name: N/A    Number of Children: N/A  . Years of Education: N/A  Occupational History  . Not on file.   Social History Main Topics  . Smoking status: Never Smoker   . Smokeless tobacco: Not on file  . Alcohol Use:   . Drug Use: No  . Sexual Activity:    Other Topics Concern  . Not on file   Social History Narrative  . No narrative on file     Review of Systems: General: negative for chills, fever, night sweats or weight changes.  Cardiovascular: negative for chest pain, dyspnea on exertion, edema, orthopnea, palpitations, paroxysmal nocturnal dyspnea or shortness of  breath Dermatological: negative for rash Respiratory: negative for cough or wheezing Urologic: negative for hematuria Abdominal: negative for nausea, vomiting, diarrhea, bright red blood per rectum, melena, or hematemesis Neurologic: negative for visual changes, syncope, or dizziness All other systems reviewed and are otherwise negative except as noted above.    Blood pressure 124/76, pulse 72, height 5\' 7"  (1.702 m), weight 196 lb 11.2 oz (89.223 kg).  General appearance: alert, cooperative and no distress Neck: no carotid bruit and no JVD Lungs: clear to auscultation bilaterally Heart: regular rate and rhythm, S1, S2 normal, no murmur, click, rub or gallop Extremities: no LEE Pulses: 2+ and symmetric Skin: warm and dry Neurologic: Grossly normal  EKG Normal sinus rhythm,bpm  ASSESSMENT AND PLAN:   1. hypertension: Controlled with medications. Continue hydrochlorothiazide and Cozaar. His desire is to eventually come off of both medications. We discussed lifestyle modifications to help him reach this goal. He was educated on the role of diet and exercise in reducing hypertension. He seems motivated to make these changes. We talked about a heart healthy diet low in sodium as well as increasing his physical activity. We set a goal in clinic for him to try to exercise at least 5 days per week, for at least 25-30 minutes per day. He states that he will try to adopt a walking program. He was provided patient education/pamphlet on healthy diet options and ways on how to incorporate physical activity into a daily schedule. Since we do not have any labs on him since 2010, I have ordered for him to get a BMP to reassess kidney function, being that he is on both hydrochlorothiazide  and Cozaar.  2. Hypertriglyceridemia: Review of his chart reveals that he had an abnormal lipid study in 2010 which revealed a triglyceride level of 396. Unfortunately, he does not have a primary care provider at this  time and has not been evaluated by provider since 2013. Given his age greater than [age] and the fact that he has other cardiac risk factors (hypertension), I feel that it is important to reassess with a fasting lipid panel. If significantly abnormal, he may require medical therapy, if this cannot adequately be controlled through diet and exercise.  PLAN  Continue treatment for hypertension as well as make the appropriate lifestyle modifications as outlined above. Check a BMP. We'll check a fasting lipid panel to assess cholesterol levels. He has been instructed to followup with Dr. Clifton Garza for reassessment in 2-3 months.  Lewanna Petrak, BRITTAINYPA-C 02/22/2014 2:50 PM

## 2015-03-06 ENCOUNTER — Other Ambulatory Visit: Payer: Self-pay | Admitting: Cardiology

## 2015-03-07 NOTE — Telephone Encounter (Signed)
REFILL 

## 2015-03-08 ENCOUNTER — Other Ambulatory Visit: Payer: Self-pay | Admitting: Cardiology

## 2015-04-01 ENCOUNTER — Other Ambulatory Visit: Payer: Self-pay | Admitting: Cardiology

## 2015-04-04 ENCOUNTER — Other Ambulatory Visit: Payer: Self-pay | Admitting: Cardiology

## 2015-04-22 ENCOUNTER — Other Ambulatory Visit: Payer: Self-pay | Admitting: *Deleted

## 2015-04-22 MED ORDER — LOSARTAN POTASSIUM 50 MG PO TABS: 50.0000 mg | ORAL_TABLET | Freq: Every day | ORAL | Status: DC

## 2015-04-22 MED ORDER — HYDROCHLOROTHIAZIDE 25 MG PO TABS: ORAL_TABLET | ORAL | Status: DC

## 2015-05-19 ENCOUNTER — Other Ambulatory Visit: Payer: Self-pay | Admitting: Cardiovascular Disease

## 2015-05-20 ENCOUNTER — Encounter: Payer: Self-pay | Admitting: Physician Assistant

## 2015-05-20 ENCOUNTER — Ambulatory Visit (INDEPENDENT_AMBULATORY_CARE_PROVIDER_SITE_OTHER): Payer: Self-pay | Admitting: Physician Assistant

## 2015-05-20 DIAGNOSIS — E785 Hyperlipidemia, unspecified: Secondary | ICD-10-CM | POA: Insufficient documentation

## 2015-05-20 DIAGNOSIS — I10 Essential (primary) hypertension: Secondary | ICD-10-CM

## 2015-05-20 LAB — BASIC METABOLIC PANEL
BUN: 18 mg/dL (ref 6–23)
CO2: 33 meq/L — AB (ref 19–32)
Calcium: 9.7 mg/dL (ref 8.4–10.5)
Chloride: 99 mEq/L (ref 96–112)
Creatinine, Ser: 1.11 mg/dL (ref 0.40–1.50)
GFR: 90.52 mL/min (ref >60.00)
Glucose, Bld: 121 mg/dL — ABNORMAL HIGH (ref 70–99)
POTASSIUM: 3.3 meq/L — AB (ref 3.5–5.1)
SODIUM: 139 meq/L (ref 135–145)

## 2015-05-20 MED ORDER — LOSARTAN POTASSIUM 100 MG PO TABS: 100.0000 mg | ORAL_TABLET | Freq: Every day | ORAL | Status: DC

## 2015-05-20 NOTE — Patient Instructions (Addendum)
Medication Instructions:  Your physician has recommended you make the following change in your medication:  1- INCREASE Cozaar 100 mg by mouth daily  Labwork: Your physician recommends that you have labs today, BMET.  Testing/Procedures: NONE  Follow-Up: Your physician wants you to follow-up in: 1 year with Dr.Clifton Jameshany. You will receive a reminder letter in the mail two months in advance. If you don't receive a letter, please call our office to schedule the follow-up appointment.  Call in a week with your BP readings. Keep a journal of your BP and heart rate.     Any Other Special Instructions Will Be Listed Below (If Applicable).  Low-Sodium Eating Plan Sodium raises blood pressure and causes water to be held in the body. Getting less sodium from food will help lower your blood pressure, reduce any swelling, and protect your heart, liver, and kidneys. We get sodium by adding salt (sodium chloride) to food. Most of our sodium comes from canned, boxed, and frozen foods. Restaurant foods, fast foods, and pizza are also very high in sodium. Even if you take medicine to lower your blood pressure or to reduce fluid in your body, getting less sodium from your food is important. WHAT IS MY PLAN? Most people should limit their sodium intake to 2,300 mg a day. Your health care provider recommends that you limit your sodium intake to __________ a day.  WHAT DO I NEED TO KNOW ABOUT THIS EATING PLAN? For the low-sodium eating plan, you will follow these general guidelines:  Choose foods with a % Daily Value for sodium of less than 5% (as listed on the food label).   Use salt-free seasonings or herbs instead of table salt or sea salt.   Check with your health care provider or pharmacist before using salt substitutes.   Eat fresh foods.  Eat more vegetables and fruits.  Limit canned vegetables. If you do use them, rinse them well to decrease the sodium.   Limit cheese to 1 oz (28 g) per  day.   Eat lower-sodium products, often labeled as "lower sodium" or "no salt added."  Avoid foods that contain monosodium glutamate (MSG). MSG is sometimes added to [place] food and some canned foods.  Check food labels (Nutrition Facts labels) on foods to learn how much sodium is in one serving.  Eat more home-cooked food and less restaurant, buffet, and fast food.  When eating at a restaurant, ask that your food be prepared with less salt or none, if possible.  HOW DO I READ FOOD LABELS FOR SODIUM INFORMATION? The Nutrition Facts label lists the amount of sodium in one serving of the food. If you eat more than one serving, you must multiply the listed amount of sodium by the number of servings. Food labels may also identify foods as:  Sodium free--Less than 5 mg in a serving.  Very low sodium--35 mg or less in a serving.  Low sodium--140 mg or less in a serving.  Light in sodium--50% less sodium in a serving. For example, if a food that usually has 300 mg of sodium is changed to become light in sodium, it will have 150 mg of sodium.  Reduced sodium--25% less sodium in a serving. For example, if a food that usually has 400 mg of sodium is changed to reduced sodium, it will have 300 mg of sodium. WHAT FOODS CAN I EAT? Grains Low-sodium cereals, including oats, puffed wheat and rice, and shredded wheat cereals. Low-sodium crackers. Unsalted rice and  pasta. Lower-sodium bread.  Vegetables Frozen or fresh vegetables. Low-sodium or reduced-sodium canned vegetables. Low-sodium or reduced-sodium tomato sauce and paste. Low-sodium or reduced-sodium tomato and vegetable juices.  Fruits Fresh, frozen, and canned fruit. Fruit juice.  Meat and Other Protein Products Low-sodium canned tuna and salmon. Fresh or frozen meat, poultry, seafood, and fish. Lamb. Unsalted nuts. Dried beans, peas, and lentils without added salt. Unsalted canned beans. Homemade soups without salt. Eggs.   Dairy Milk. Soy milk. Ricotta cheese. Low-sodium or reduced-sodium cheeses. Yogurt.  Condiments Fresh and dried herbs and spices. Salt-free seasonings. Onion and garlic powders. Low-sodium varieties of mustard and ketchup. Lemon juice.  Fats and Oils Reduced-sodium salad dressings. Unsalted butter.  Other Unsalted popcorn and pretzels.  The items listed above may not be a complete list of recommended foods or beverages. Contact your dietitian for more options. WHAT FOODS ARE NOT RECOMMENDED? Grains Instant hot cereals. Bread stuffing, pancake, and biscuit mixes. Croutons. Seasoned rice or pasta mixes. Noodle soup cups. Boxed or frozen macaroni and cheese. Self-rising flour. Regular salted crackers. Vegetables Regular canned vegetables. Regular canned tomato sauce and paste. Regular tomato and vegetable juices. Frozen vegetables in sauces. Salted french fries. Olives. Rosita Fire. Relishes. Sauerkraut. Salsa. Meat and Other Protein Products Salted, canned, smoked, spiced, or pickled meats, seafood, or fish. Bacon, ham, sausage, hot dogs, corned beef, chipped beef, and packaged luncheon meats. Salt pork. Jerky. Pickled herring. Anchovies, regular canned tuna, and sardines. Salted nuts. Dairy Processed cheese and cheese spreads. Cheese curds. Blue cheese and cottage cheese. Buttermilk.  Condiments Onion and garlic salt, seasoned salt, table salt, and sea salt. Canned and packaged gravies. Worcestershire sauce. Tartar sauce. Barbecue sauce. Teriyaki sauce. Soy sauce, including reduced sodium. Steak sauce. Fish sauce. Oyster sauce. Cocktail sauce. Horseradish. Regular ketchup and mustard. Meat flavorings and tenderizers. Bouillon cubes. Hot sauce. Tabasco sauce. Marinades. Taco seasonings. Relishes. Fats and Oils Regular salad dressings. Salted butter. Margarine. Ghee. Bacon fat.  Other Potato and tortilla chips. Corn chips and puffs. Salted popcorn and pretzels. Canned or dried soups.  Pizza. Frozen entrees and pot pies.  The items listed above may not be a complete list of foods and beverages to avoid. Contact your dietitian for more information. Document Released: 02/06/2002 Document Revised: 08/22/2013 Document Reviewed: 06/21/2013 [facility] Patient Information 2015 [place], [place]. This information is not intended to replace advice given to you by your health care provider. Make sure you discuss any questions you have with your health care provider. Exercise to Lose Weight Exercise and a healthy diet may help you lose weight. Your doctor may suggest specific exercises. EXERCISE IDEAS AND TIPS  Choose low-cost things you enjoy doing, such as walking, bicycling, or exercising to workout videos.  Take stairs instead of the elevator.  Walk during your lunch break.  Park your car further away from work or school.  Go to a gym or an exercise class.  Start with 5 to 10 minutes of exercise each day. Build up to 30 minutes of exercise 4 to 6 days a week.  Wear shoes with good support and comfortable clothes.  Stretch before and after working out.  Work out until you breathe harder and your heart beats faster.  Drink extra water when you exercise.  Do not do so much that you hurt yourself, feel dizzy, or get very short of breath. Exercises that burn about 150 calories:  Running 1  miles in 15 minutes.  Playing volleyball for 45 to 60 minutes.  Washing and waxing  a car for 45 to 60 minutes.  Playing touch football for 45 minutes.  Walking 1  miles in 35 minutes.  Pushing a stroller 1  miles in 30 minutes.  Playing basketball for 30 minutes.  Raking leaves for 30 minutes.  Bicycling 5 miles in 30 minutes.  Walking 2 miles in 30 minutes.  Dancing for 30 minutes.  Shoveling snow for 15 minutes.  Swimming laps for 20 minutes.  Walking up stairs for 15 minutes.  Bicycling 4 miles in 15 minutes.  Gardening for 30 to 45 minutes.  Jumping rope for  15 minutes.  Washing windows or floors for 45 to 60 minutes. Document Released: 09/19/2010 Document Revised: 11/09/2011 Document Reviewed: 09/19/2010 [facility] Patient Information 2015 [place], [place]. This information is not intended to replace advice given to you by your health care provider. Make sure you discuss any questions you have with your health care provider.

## 2015-05-20 NOTE — Assessment & Plan Note (Signed)
Patient has history of hypertriglyceridemia. Recommend fasting lipid panel and LFTs. This was supposed to be done last year but he never returned.

## 2015-05-20 NOTE — Assessment & Plan Note (Signed)
Patient's blood pressure is elevated today. He didn't take his medication last night or this morning. He claims to be compliant most of the time. He is not watching his diet or exercising. I had along discussion with him about compliance and consequences. Will increase Cozaar to 100 mg daily. Continue HCTZ. He will continue to monitor his blood pressures at home. He is looking to obtain a doctor in [place]. Check labs today.

## 2015-05-20 NOTE — Progress Notes (Signed)
Cardiology Office Note   Date:  05/20/2015   ID:  Logan Garza, DOB 02-06-1966, MRN [id number]  PCP:  No PCP Per Patient  Cardiologist:Dr. Clifton James   Chief Complaint: Yearly follow-up for high blood pressure    History of Present Illness: Logan Garza is a 49 y.o. male who presents for yearly follow-up. He has history of hypertension with LVH and grade 1 diastolic dysfunction on 2-D echo in 2010. He has been treated with HCTZ and Cozaar. He also has hyper triglyceridemia. Lipid profile is to be checked last year but was never done. He had history of atypical chest pain 2010. No ischemic workup was performed.  Patient comes in today for yearly follow-up. He actually moved to [place] but hasn't obtained a doctor there. He ran out of his medications for a couple weeks. He did not take his medication last night or this morning. His blood pressure is high today. He does keep her regular check of it and it's been running around 149-160/87. He travels a lot for work and does not get regular exercise. He does not eat well either.   Past Medical History  Diagnosis Date  . Hypertension     No past surgical history on file.   Current Outpatient Prescriptions  Medication Sig Dispense Refill  . hydrochlorothiazide (HYDRODIURIL) 25 MG tablet TAKE 1 TABLET (25 MG TOTAL) BY MOUTH DAILY 30 tablet 0  . losartan (COZAAR) 50 MG tablet Take 1 tablet (50 mg total) by mouth daily. 30 tablet 0   No current facility-administered medications for this visit.    Allergies:   Review of patient's allergies indicates no known allergies.    Social History:  The patient  reports that he has never smoked. He does not have any smokeless tobacco history on file. He reports that he does not use illicit drugs.   Family History:  The patient's    family history includes Hypertension in his father and mother.    ROS:  Please see the history of present illness.   Otherwise, review of systems are positive for  none.   All other systems are reviewed and negative.    PHYSICAL EXAM: VS:  BP 150/104 mmHg  Pulse 87  Ht  (1.702 m)  Wt 199 lb 1.9 oz (90.32 kg)  BMI 31.18 kg/m2 , BMI Body mass index is 31.18 kg/(m^2). GEN: Well nourished, well developed, in no acute distress Neck: no JVD, HJR, carotid bruits, or masses Cardiac: RRR; positive S4 no murmurs, rubs, thrill or heave,  Respiratory:  clear to auscultation bilaterally, normal work of breathing GI: soft, nontender, nondistended, + BS MS: no deformity or atrophy Extremities: without cyanosis, clubbing, edema, good distal pulses bilaterally.  Skin: warm and dry, no rash Neuro:  Strength and sensation are intact    EKG:  EKG is ordered today. The ekg ordered today demonstrates normal sinus rhythm with LVH Recent Labs: No results found for requested labs within last 365 days.    Lipid Panel    Component Value Date/Time   CHOL  07/30/2009 0139    168        ATP III CLASSIFICATION:  <200     mg/dL   Desirable  119-147  mg/dL   Borderline High  >=829    mg/dL   High          TRIG 562* 07/30/2009 0139   HDL 24* 07/30/2009 0139   CHOLHDL 7.0 07/30/2009 0139   VLDL 79* 07/30/2009 0139  LDLCALC  07/30/2009 0139    65        Total Cholesterol/HDL:CHD Risk Coronary Heart Disease Risk Table                     Men   Women  1/2 Average Risk   3.4   3.3  Average Risk       5.0   4.4  2 X Average Risk   9.6   7.1  3 X Average Risk  23.4   11.0        Use the calculated Patient Ratio above and the CHD Risk Table to determine the patient's CHD Risk.        ATP III CLASSIFICATION (LDL):  <100     mg/dL   Optimal  161-096  mg/dL   Near or Above                    Optimal  130-159  mg/dL   Borderline  045-409  mg/dL   High  >811     mg/dL   Very High      Wt Readings from Last 3 Encounters:  05/20/15 199 lb 1.9 oz (90.32 kg)  02/22/14 196 lb 11.2 oz (89.223 kg)  05/03/12 198 lb 6.4 oz (89.994 kg)      Other studies  Reviewed: Additional studies/ records that were reviewed today include and review of the records demonstrates:  2D echo Procedure date:  08/21/2009   Findings:      Left ventricle: The cavity size was normal. Wall thickness was     increased in a pattern of mild LVH. Systolic function was normal.     The estimated ejection fraction was in the range of 60% to 65%. Wall     motion was normal; there were no regional wall motion abnormalities.     Doppler parameters are consistent with abnormal left ventricular     relaxation (grade 1 diastolic dysfunction     ASSESSMENT AND PLAN:  ESSENTIAL HYPERTENSION, BENIGN Patient's blood pressure is elevated today. He didn't take his medication last night or this morning. He claims to be compliant most of the time. He is not watching his diet or exercising. I had along discussion with him about compliance and consequences. Will increase Cozaar to 100 mg daily. Continue HCTZ. He will continue to monitor his blood pressures at home. He is looking to obtain a doctor in [place]. Check labs today.  Hyperlipidemia Patient has history of hypertriglyceridemia. Recommend fasting lipid panel and LFTs. This was supposed to be done last year but he never returned.    Signed, Jacolyn Reedy, PA-C  05/20/2015 1:38 PM    [facility] [address] [place], [place], [place]  [address] Phone: [phone]; Fax: [phone]

## 2015-05-21 ENCOUNTER — Telehealth: Payer: Self-pay | Admitting: *Deleted

## 2015-05-21 DIAGNOSIS — E876 Hypokalemia: Secondary | ICD-10-CM

## 2015-05-21 NOTE — Telephone Encounter (Signed)
Left message to call back  Discussed K+ level of 3.3 with Bing Neighbors PA and will have patient start K+ 10 meq 2 tablets on day 1 and then 1 daily  Recheck BMET in 1 week.

## 2015-05-22 ENCOUNTER — Other Ambulatory Visit: Payer: Self-pay | Admitting: Cardiovascular Disease

## 2015-05-22 MED ORDER — POTASSIUM CHLORIDE ER 10 MEQ PO TBCR: 10.0000 meq | EXTENDED_RELEASE_TABLET | Freq: Every day | ORAL | Status: DC

## 2015-05-22 NOTE — Telephone Encounter (Signed)
Called patient about lab results. Per Tereso Newcomer PA, patient's potassium level is 3.3 will have patient start K+ 10 meq, 2 tablets on day 1 and then 1 daily, and then recheck BMET in 1 week. Patient verbalized understanding. Scheduled and ordered BMET and medication sent to patient's pharmacy.

## 2015-05-31 ENCOUNTER — Other Ambulatory Visit (INDEPENDENT_AMBULATORY_CARE_PROVIDER_SITE_OTHER): Payer: Self-pay

## 2015-05-31 DIAGNOSIS — I10 Essential (primary) hypertension: Secondary | ICD-10-CM

## 2015-05-31 LAB — BASIC METABOLIC PANEL
BUN: 19 mg/dL (ref 6–23)
CHLORIDE: 98 meq/L (ref 96–112)
CO2: 33 meq/L — AB (ref 19–32)
CREATININE: 1.07 mg/dL (ref 0.40–1.50)
Calcium: 10 mg/dL (ref 8.4–10.5)
GFR: 94.42 mL/min (ref >60.00)
Glucose, Bld: 87 mg/dL (ref 70–99)
POTASSIUM: 4 meq/L (ref 3.5–5.1)
SODIUM: 138 meq/L (ref 135–145)

## 2015-06-05 ENCOUNTER — Telehealth: Payer: Self-pay

## 2015-06-05 NOTE — Telephone Encounter (Signed)
Pharmacy called for a verbal refill for Losartan 100 mg.

## 2015-07-20 ENCOUNTER — Other Ambulatory Visit: Payer: Self-pay | Admitting: Physician Assistant

## 2015-10-14 ENCOUNTER — Other Ambulatory Visit: Payer: Self-pay | Admitting: Cardiovascular Disease

## 2015-11-24 ENCOUNTER — Other Ambulatory Visit: Payer: Self-pay | Admitting: Cardiovascular Disease

## 2016-06-16 ENCOUNTER — Other Ambulatory Visit: Payer: Self-pay | Admitting: Cardiovascular Disease

## 2016-06-16 DIAGNOSIS — I10 Essential (primary) hypertension: Secondary | ICD-10-CM

## 2016-07-02 ENCOUNTER — Other Ambulatory Visit: Payer: Self-pay | Admitting: Cardiovascular Disease

## 2016-07-02 DIAGNOSIS — I10 Essential (primary) hypertension: Secondary | ICD-10-CM

## 2016-07-14 ENCOUNTER — Other Ambulatory Visit: Payer: Self-pay | Admitting: Cardiovascular Disease

## 2016-07-14 DIAGNOSIS — I10 Essential (primary) hypertension: Secondary | ICD-10-CM

## 2016-08-05 ENCOUNTER — Other Ambulatory Visit: Payer: Self-pay | Admitting: Cardiovascular Disease

## 2016-08-05 DIAGNOSIS — I10 Essential (primary) hypertension: Secondary | ICD-10-CM

## 2016-08-24 ENCOUNTER — Other Ambulatory Visit: Payer: Self-pay | Admitting: Physician Assistant
